# Patient Record
Sex: Female | Born: 1970 | Race: White | Hispanic: No | Marital: Married | Smoking: Never smoker
Health system: Southern US, Community
[De-identification: ages and names within clinical notes are randomized; demographics above are authoritative.]

## PROBLEM LIST (undated history)

## (undated) DIAGNOSIS — Z8619 Personal history of other infectious and parasitic diseases: Secondary | ICD-10-CM

## (undated) DIAGNOSIS — B9689 Other specified bacterial agents as the cause of diseases classified elsewhere: Secondary | ICD-10-CM

## (undated) DIAGNOSIS — E559 Vitamin D deficiency, unspecified: Secondary | ICD-10-CM

## (undated) DIAGNOSIS — G510 Bell's palsy: Secondary | ICD-10-CM

## (undated) DIAGNOSIS — F419 Anxiety disorder, unspecified: Secondary | ICD-10-CM

## (undated) DIAGNOSIS — M8580 Other specified disorders of bone density and structure, unspecified site: Secondary | ICD-10-CM

## (undated) DIAGNOSIS — B379 Candidiasis, unspecified: Secondary | ICD-10-CM

## (undated) DIAGNOSIS — N760 Acute vaginitis: Secondary | ICD-10-CM

## (undated) HISTORY — PX: WISDOM TOOTH EXTRACTION: SHX21

## (undated) HISTORY — DX: Acute vaginitis: N76.0

## (undated) HISTORY — DX: Personal history of other infectious and parasitic diseases: Z86.19

## (undated) HISTORY — DX: Bell's palsy: G51.0

## (undated) HISTORY — DX: Other specified bacterial agents as the cause of diseases classified elsewhere: B96.89

## (undated) HISTORY — DX: Vitamin D deficiency, unspecified: E55.9

## (undated) HISTORY — DX: Candidiasis, unspecified: B37.9

## (undated) HISTORY — DX: Anxiety disorder, unspecified: F41.9

## (undated) HISTORY — DX: Other specified disorders of bone density and structure, unspecified site: M85.80

---

## 1999-12-18 ENCOUNTER — Encounter: Admission: RE | Admit: 1999-12-18 | Discharge: 1999-12-18 | Payer: Self-pay | Admitting: Urology

## 1999-12-18 ENCOUNTER — Encounter: Payer: Self-pay | Admitting: Urology

## 2000-06-02 ENCOUNTER — Other Ambulatory Visit: Admission: RE | Admit: 2000-06-02 | Discharge: 2000-06-02 | Payer: Self-pay | Admitting: Internal Medicine

## 2001-06-08 ENCOUNTER — Other Ambulatory Visit: Admission: RE | Admit: 2001-06-08 | Discharge: 2001-06-08 | Payer: Self-pay | Admitting: Obstetrics and Gynecology

## 2002-08-29 ENCOUNTER — Other Ambulatory Visit: Admission: RE | Admit: 2002-08-29 | Discharge: 2002-08-29 | Payer: Self-pay | Admitting: Obstetrics and Gynecology

## 2003-09-27 ENCOUNTER — Other Ambulatory Visit: Admission: RE | Admit: 2003-09-27 | Discharge: 2003-09-27 | Payer: Self-pay | Admitting: Obstetrics and Gynecology

## 2004-12-09 ENCOUNTER — Other Ambulatory Visit: Admission: RE | Admit: 2004-12-09 | Discharge: 2004-12-09 | Payer: Self-pay | Admitting: Obstetrics and Gynecology

## 2005-10-12 ENCOUNTER — Emergency Department (HOSPITAL_COMMUNITY): Admission: EM | Admit: 2005-10-12 | Discharge: 2005-10-12 | Payer: Self-pay | Admitting: *Deleted

## 2005-11-17 ENCOUNTER — Other Ambulatory Visit: Admission: RE | Admit: 2005-11-17 | Discharge: 2005-11-17 | Payer: Self-pay | Admitting: Obstetrics and Gynecology

## 2005-11-25 ENCOUNTER — Encounter: Admission: RE | Admit: 2005-11-25 | Discharge: 2005-11-25 | Payer: Self-pay | Admitting: Obstetrics and Gynecology

## 2006-02-17 ENCOUNTER — Emergency Department (HOSPITAL_COMMUNITY): Admission: EM | Admit: 2006-02-17 | Discharge: 2006-02-17 | Payer: Self-pay | Admitting: Emergency Medicine

## 2010-08-09 ENCOUNTER — Encounter: Admission: RE | Admit: 2010-08-09 | Discharge: 2010-08-09 | Payer: Self-pay | Admitting: Obstetrics and Gynecology

## 2011-07-02 ENCOUNTER — Other Ambulatory Visit: Payer: Self-pay | Admitting: Internal Medicine

## 2011-07-02 DIAGNOSIS — Z1231 Encounter for screening mammogram for malignant neoplasm of breast: Secondary | ICD-10-CM

## 2011-08-14 ENCOUNTER — Ambulatory Visit
Admission: RE | Admit: 2011-08-14 | Discharge: 2011-08-14 | Disposition: A | Discharge status: Home / Self Care | Payer: 59 | Source: Ambulatory Visit | Attending: Internal Medicine | Admitting: Internal Medicine

## 2011-08-14 DIAGNOSIS — Z1231 Encounter for screening mammogram for malignant neoplasm of breast: Secondary | ICD-10-CM

## 2012-02-18 ENCOUNTER — Other Ambulatory Visit: Payer: Self-pay | Admitting: Obstetrics and Gynecology

## 2012-02-18 NOTE — Telephone Encounter (Signed)
[facility] PT 

## 2012-02-18 NOTE — Telephone Encounter (Signed)
Lm on vm to cb per rx request for bc.

## 2012-02-19 ENCOUNTER — Ambulatory Visit: Payer: Self-pay | Admitting: Obstetrics and Gynecology

## 2012-02-19 NOTE — Telephone Encounter (Signed)
Lm on vm to cb per rx req. 

## 2012-02-20 MED ORDER — NORETHIN-ETH ESTRAD TRIPHASIC 0.5/0.75/1-35 MG-MCG PO TABS: 1.0000 | ORAL_TABLET | Freq: Every day | ORAL | Status: AC

## 2012-02-20 MED ORDER — NORETHIN-ETH ESTRAD TRIPHASIC 0.5/0.75/1-35 MG-MCG PO TABS: 1.0000 | ORAL_TABLET | Freq: Every day | ORAL | Status: DC

## 2012-02-20 NOTE — Telephone Encounter (Signed)
Tc from pt per rx req. Informed pt needs name of pharm to send rx to. Pt uses [facility]([place]). AEX sched 05/05/12 with [facility]. Rx ON 777 e-pres to pharm. Pt voices understanding.

## 2012-05-05 ENCOUNTER — Ambulatory Visit (INDEPENDENT_AMBULATORY_CARE_PROVIDER_SITE_OTHER): Payer: 59 | Admitting: Obstetrics and Gynecology

## 2012-05-05 ENCOUNTER — Encounter: Payer: Self-pay | Admitting: Obstetrics and Gynecology

## 2012-05-05 VITALS — BP 126/70 | Ht 66.0 in | Wt 142.0 lb

## 2012-05-05 DIAGNOSIS — Z8742 Personal history of other diseases of the female genital tract: Secondary | ICD-10-CM

## 2012-05-05 DIAGNOSIS — N760 Acute vaginitis: Secondary | ICD-10-CM | POA: Insufficient documentation

## 2012-05-05 DIAGNOSIS — E559 Vitamin D deficiency, unspecified: Secondary | ICD-10-CM | POA: Insufficient documentation

## 2012-05-05 DIAGNOSIS — B379 Candidiasis, unspecified: Secondary | ICD-10-CM | POA: Insufficient documentation

## 2012-05-05 DIAGNOSIS — Z8619 Personal history of other infectious and parasitic diseases: Secondary | ICD-10-CM | POA: Insufficient documentation

## 2012-05-05 DIAGNOSIS — G510 Bell's palsy: Secondary | ICD-10-CM | POA: Insufficient documentation

## 2012-05-05 DIAGNOSIS — F411 Generalized anxiety disorder: Secondary | ICD-10-CM

## 2012-05-05 DIAGNOSIS — M8580 Other specified disorders of bone density and structure, unspecified site: Secondary | ICD-10-CM | POA: Insufficient documentation

## 2012-05-05 DIAGNOSIS — F419 Anxiety disorder, unspecified: Secondary | ICD-10-CM | POA: Insufficient documentation

## 2012-05-05 DIAGNOSIS — B9689 Other specified bacterial agents as the cause of diseases classified elsewhere: Secondary | ICD-10-CM | POA: Insufficient documentation

## 2012-05-05 NOTE — Progress Notes (Signed)
Subjective:  AEX:  Last Pap: 02/27/2010 WNL: Yes Regular Periods:yes Contraception: Nortrel 7,7,7  Monthly Breast exam:yes Tetanus<40yrs:yes Nl.Bladder Function:yes Daily BMs:yes Healthy Diet:yes Calcium:no Mammogram:yes Date of Mammogram: 07/2011 Exercise:yes Have often Exercise: daily Seatbelt: yes Abuse at home: no Stressful work:no Sigmoid-colonoscopy: n/a Bone Density: Yes [facility] 2011 PCP: [facility]  Change in PMH: None Change in [facility]: None    Jodi Tucker is a 41 y.o. female, G2P0011, who presents for an annual exam.     History   Social History  . Marital Status: Married    Spouse Name: N/A    Number of Children: N/A  . Years of Education: N/A   Social History Main Topics  . Smoking status: Never Smoker   . Smokeless tobacco: Never Used  . Alcohol Use: No  . Drug Use: No  . Sexually Active: Yes    Birth Control/ Protection: Pill     Nortrel 7 7 7     Other Topics Concern  . None   Social History Narrative  . None    Menstrual cycle:   LMP: Patient's last menstrual period was 05/04/2012.           Cycle: Regular on BCPs.  occassional meds needed for cramps  The following portions of the patient's history were reviewed and updated as appropriate: allergies, current medications, past family history, past medical history, past social history, past surgical history and problem list.  Review of Systems Pertinent items are noted in HPI. Breast:Negative for breast lump,nipple discharge or nipple retraction Gastrointestinal: Negative for abdominal pain, change in bowel habits or rectal bleeding Urinary:negative   Objective:    BP 126/70  Ht 5\' 6"  (1.676 m)  Wt 142 lb (64.411 kg)  BMI 22.92 kg/m2  LMP 05/04/2012    Weight:  Wt Readings from Last 1 Encounters:  05/05/12 142 lb (64.411 kg)          BMI: Body mass index is 22.92 kg/(m^2).  General Appearance: Alert, appropriate appearance for age. No acute distress HEENT:  Grossly normal Neck / Thyroid: Supple, no masses, nodes or enlargement Lungs: clear to auscultation bilaterally Back: No CVA tenderness Breast Exam: No masses or nodes.No dimpling, nipple retraction or discharge. Cardiovascular: Regular rate and rhythm. S1, S2, no murmur Gastrointestinal: Soft, non-tender, no masses or organomegaly Pelvic Exam: Vulva and vagina appear normal. Bimanual exam reveals normal uterus and adnexa. Rectovaginal: normal rectal, no masses Lymphatic Exam: Non-palpable nodes in neck, clavicular, axillary, or inguinal regions Skin: no rash or abnormalities Neurologic: Normal gait and speech, no tremor  Psychiatric: Alert and oriented, appropriate affect.   Wet Prep:not done Urinalysis:not applicable UPT: Not done   Assessment:    Normal gyn exam  Dysmenorrhea,only occassional meds required  Plan:    Mammogram due 07/2012 pap smear due 2014 return annually or prn STD screening: declined Contraception:oral contraceptives (estrogen/progesterone)      Dillard, HeatherMD

## 2012-06-02 ENCOUNTER — Other Ambulatory Visit: Payer: Self-pay | Admitting: Obstetrics and Gynecology

## 2012-06-18 ENCOUNTER — Emergency Department (HOSPITAL_COMMUNITY)
Admission: EM | Admit: 2012-06-18 | Discharge: 2012-06-19 | Disposition: A | Discharge status: Home / Self Care | Payer: 59 | Attending: Emergency Medicine | Admitting: Emergency Medicine

## 2012-06-18 ENCOUNTER — Encounter (HOSPITAL_COMMUNITY): Payer: Self-pay | Admitting: *Deleted

## 2012-06-18 DIAGNOSIS — R82998 Other abnormal findings in urine: Secondary | ICD-10-CM | POA: Insufficient documentation

## 2012-06-18 DIAGNOSIS — N201 Calculus of ureter: Secondary | ICD-10-CM | POA: Insufficient documentation

## 2012-06-18 DIAGNOSIS — R8271 Bacteriuria: Secondary | ICD-10-CM

## 2012-06-18 DIAGNOSIS — E876 Hypokalemia: Secondary | ICD-10-CM

## 2012-06-18 DIAGNOSIS — N200 Calculus of kidney: Secondary | ICD-10-CM

## 2012-06-18 DIAGNOSIS — R109 Unspecified abdominal pain: Secondary | ICD-10-CM | POA: Insufficient documentation

## 2012-06-18 MED ORDER — ONDANSETRON HCL 4 MG/2ML IJ SOLN
4.0000 mg | Freq: Once | INTRAMUSCULAR | Status: AC
  Administered 2012-06-18: 4 mg via INTRAVENOUS
  Filled 2012-06-18: qty 2

## 2012-06-18 MED ORDER — ONDANSETRON HCL 4 MG/2ML IJ SOLN: 4.0000 mg | Freq: Once | INTRAMUSCULAR | Status: DC

## 2012-06-18 MED ORDER — HYDROMORPHONE HCL PF 1 MG/ML IJ SOLN
1.0000 mg | Freq: Once | INTRAMUSCULAR | Status: AC
  Administered 2012-06-18: 1 mg via INTRAVENOUS
  Filled 2012-06-18: qty 1

## 2012-06-18 MED ORDER — KETOROLAC TROMETHAMINE 30 MG/ML IJ SOLN
30.0000 mg | Freq: Once | INTRAMUSCULAR | Status: AC
  Administered 2012-06-18: 30 mg via INTRAVENOUS
  Filled 2012-06-18: qty 1

## 2012-06-18 MED ORDER — FENTANYL CITRATE 0.05 MG/ML IJ SOLN: 50.0000 ug | Freq: Once | INTRAMUSCULAR | Status: DC

## 2012-06-18 NOTE — ED Notes (Addendum)
C/o r flank pain, also nv, onset 3 hrs ago. Vomited aleve PTA.  Pt restless, writhing in pain. Acuity 2 d/t pain.

## 2012-06-18 NOTE — ED Provider Notes (Signed)
History     CSN: [id number]  Arrival date & time 06/18/12  2301   First MD Initiated Contact with Patient 06/18/12 2329      Chief Complaint  Patient presents with  . Flank Pain    (Consider location/radiation/quality/duration/timing/severity/associated sxs/prior treatment) HPI Comments: 41 year old female with a history of occasional urinary infectionspresent with a complaint of right flank pain. This was acute in onset approximately 8:30 this evening, waxes and wanes in intensity, sharp and stabbing sensation to the right flank and radiating to the right upper quadrant.  She denies having symptoms like this in the past, she did note having occasional dysuria this week prior to the symptoms started. She has no hematuria, no fever, no chills, no cough or shortness of breath and has had no change in her stools. Symptoms are severe at worst  Patient is a 41 y.o. female presenting with flank pain. The history is provided by the patient.  Flank Pain    Past Medical History  Diagnosis Date  . History of chicken pox   . Preterm labor   . Yeast infection   . BV (bacterial vaginosis)   . Osteopenia   . Anxiety   . Bell's palsy   . Vitamin d deficiency     Past Surgical History  Procedure Date  . Wisdom tooth extraction     Family History  Problem Relation Age of Onset  . Crohn's disease Sister   . Crohn's disease Mother   . Crohn's disease Father   . Diabetes Paternal Grandmother   . Birth defects Cousin     History  Substance Use Topics  . Smoking status: Never Smoker   . Smokeless tobacco: Never Used  . Alcohol Use: No    OB History    Grav Para Term Preterm Abortions TAB SAB Ect Mult Living   2 1   1     1       Review of Systems  Genitourinary: Positive for flank pain.  All other systems reviewed and are negative.    Allergies  Other  Home Medications   Current Outpatient Rx  Name Route Sig Dispense Refill  . CITALOPRAM HYDROBROMIDE 20 MG PO TABS  Oral Take 20 mg by mouth daily.    Kathrynn Running ESTRAD TRIPHASIC 0.5/0.75/1-35 MG-MCG PO TABS Oral Take 1 tablet by mouth daily. 1 Package 2  . HYDROCODONE-ACETAMINOPHEN 5-500 MG PO TABS Oral Take 1-2 tablets by mouth every 6 (six) hours as needed for pain. 15 tablet 0  . NAPROXEN 500 MG PO TABS Oral Take 1 tablet (500 mg total) by mouth 2 (two) times daily with a meal. 30 tablet 0  . PROMETHAZINE HCL 25 MG PO TABS Oral Take 1 tablet (25 mg total) by mouth every 6 (six) hours as needed for nausea. 12 tablet 0  . SULFAMETHOXAZOLE-TRIMETHOPRIM 800-160 MG PO TABS Oral Take 1 tablet by mouth every 12 (twelve) hours. 6 tablet 0  . TAMSULOSIN HCL 0.4 MG PO CAPS Oral Take 1 capsule (0.4 mg total) by mouth 2 (two) times daily. 10 capsule 0    BP 142/111  Pulse 94  Temp 96.1 F (35.6 C) (Oral)  Resp 28  SpO2 99%  LMP 06/01/2012  Physical Exam  Nursing note and vitals reviewed. Constitutional: She appears well-developed and well-nourished. She appears distressed.  HENT:  Head: Normocephalic and atraumatic.  Mouth/Throat: Oropharynx is clear and moist. No oropharyngeal exudate.  Eyes: Conjunctivae normal and EOM are normal. Pupils are equal, round,  and reactive to light. Right eye exhibits no discharge. Left eye exhibits no discharge. No scleral icterus.  Neck: Normal range of motion. Neck supple. No JVD present. No thyromegaly present.  Cardiovascular: Normal rate, regular rhythm, normal heart sounds and intact distal pulses.  Exam reveals no gallop and no friction rub.   No murmur heard. Pulmonary/Chest: Effort normal and breath sounds normal. No respiratory distress. She has no wheezes. She has no rales.  Abdominal: Soft. Bowel sounds are normal. She exhibits no distension and no mass. There is tenderness ( Minimal right-sided tenderness, no pain at McBurney's point, no Murphy sign).       No left-sided tenderness, no peritoneal signs, right CVA tenderness present  Musculoskeletal: Normal  range of motion. She exhibits no edema and no tenderness.  Lymphadenopathy:    She has no cervical adenopathy.  Neurological: She is alert. Coordination normal.  Skin: Skin is warm and dry. No rash noted. No erythema.  Psychiatric: She has a normal mood and affect. Her behavior is normal.    ED Course  Procedures (including critical care time)  Labs Reviewed  URINALYSIS, ROUTINE W REFLEX MICROSCOPIC - Abnormal; Notable for the following:    APPearance TURBID (*)     Ketones, ur 15 (*)     All other components within normal limits  BASIC METABOLIC PANEL - Abnormal; Notable for the following:    Potassium 3.2 (*)     Glucose, Bld 105 (*)     All other components within normal limits  URINE MICROSCOPIC-ADD ON - Abnormal; Notable for the following:    Bacteria, UA MANY (*)     All other components within normal limits   Ct Abdomen Pelvis Wo Contrast  06/19/2012  *RADIOLOGY REPORT*  Clinical Data: Right flank and right lower quadrant abdominal pain.  CT ABDOMEN AND PELVIS WITHOUT CONTRAST  Technique:  Multidetector CT imaging of the abdomen and pelvis was performed following the standard protocol without intravenous contrast.  Comparison: None.  Findings: Dilatation of the right renal collecting system and ureter to the level of a 2 mm linear calcification in the distal ureter, just proximal to the ureteral vesicle junction.  Multiple small right pelvic phleboliths are also noted.  There is a 4 mm calculus in the upper pole of the left kidney.  No left ureteral calculi.  Poorly distended bladder with no bladder calculi demonstrated.  Unremarkable noncontrasted appearance of the uterus, ovaries, adrenal glands, liver, spleen, pancreas and gallbladder.  No gastrointestinal abnormalities or enlarged lymph nodes.  Normal appearing appendix.  Moderate posterior disc bulging at the L5-S1 level and mild diffuse disc bulging at the L2-3, L3-4 and L4-5 levels.  Clear lung bases.  IMPRESSION:  1.  2 mm  calculus in the distal right ureter, causing moderate right hydronephrosis and hydroureter. 2.  4 mm nonobstructing upper pole left renal calculus.   Original Report Authenticated By: Darrol Angel, M.D.      1. Kidney stone on right side   2. Bacteriuria   3. Hypokalemia       MDM  Uncomfortable appearing, presentation consistent with possible kidney stone, would consider a direct quadrant etiologies however CT scan of the abdomen and pelvis without contrast has been ordered for most likely diagnosis, urinalysis, check renal function, pain medications and reevaluate.   The patient has had complete relief of her symptoms with medications, I have reviewed her CT scan and laboratory data showing a mild hypokalemia, bacteriuria and a CT scan showing  a 2 mm distal right kidney stone. The patient appears comfortable and appears stable for discharge.  Discharge Prescriptions include:  Naprosyn Hydrocodone Flomax Phenergan      Vida Roller, MD 06/19/12 0104

## 2012-06-19 ENCOUNTER — Emergency Department (HOSPITAL_COMMUNITY): Payer: 59

## 2012-06-19 LAB — URINALYSIS, ROUTINE W REFLEX MICROSCOPIC
Bilirubin Urine: NEGATIVE
Glucose, UA: NEGATIVE mg/dL
Hgb urine dipstick: NEGATIVE
Ketones, ur: 15 mg/dL — AB
Leukocytes, UA: NEGATIVE
Nitrite: NEGATIVE
Protein, ur: NEGATIVE mg/dL
Specific Gravity, Urine: 1.018 (ref 1.005–1.030)
Urobilinogen, UA: 0.2 mg/dL (ref 0.0–1.0)
pH: 8 (ref 5.0–8.0)

## 2012-06-19 LAB — BASIC METABOLIC PANEL
BUN: 8 mg/dL (ref 6–23)
CO2: 19 mEq/L (ref 19–32)
Calcium: 10.3 mg/dL (ref 8.4–10.5)
Chloride: 97 mEq/L (ref 96–112)
Creatinine, Ser: 0.74 mg/dL (ref 0.50–1.10)
GFR calc Af Amer: >90 mL/min (ref >90)
GFR calc non Af Amer: >90 mL/min (ref >90)
Glucose, Bld: 105 mg/dL — ABNORMAL HIGH (ref 70–99)
Potassium: 3.2 mEq/L — ABNORMAL LOW (ref 3.5–5.1)
Sodium: 135 mEq/L (ref 135–145)

## 2012-06-19 LAB — URINE MICROSCOPIC-ADD ON

## 2012-06-19 MED ORDER — TAMSULOSIN HCL 0.4 MG PO CAPS: 0.4000 mg | ORAL_CAPSULE | Freq: Two times a day (BID) | ORAL | Status: AC

## 2012-06-19 MED ORDER — HYDROCODONE-ACETAMINOPHEN 5-500 MG PO TABS: 1.0000 | ORAL_TABLET | Freq: Four times a day (QID) | ORAL | Status: AC | PRN

## 2012-06-19 MED ORDER — SULFAMETHOXAZOLE-TRIMETHOPRIM 800-160 MG PO TABS: 1.0000 | ORAL_TABLET | Freq: Two times a day (BID) | ORAL | Status: AC

## 2012-06-19 MED ORDER — PROMETHAZINE HCL 25 MG PO TABS: 25.0000 mg | ORAL_TABLET | Freq: Four times a day (QID) | ORAL | Status: AC | PRN

## 2012-06-19 MED ORDER — NAPROXEN 500 MG PO TABS: 500.0000 mg | ORAL_TABLET | Freq: Two times a day (BID) | ORAL | Status: AC

## 2012-06-19 NOTE — ED Notes (Signed)
Pt writhing in pain  C/o sudden onset RUQ abd pain , right flank pain with radiaton to right back.  Sudden onset at 2000 hrs associated with nausea and vomiting times three,  Actively wretching.  Family at bedside.  IV hydration in progress.

## 2012-07-07 ENCOUNTER — Other Ambulatory Visit: Payer: Self-pay | Admitting: Internal Medicine

## 2012-07-07 DIAGNOSIS — Z1231 Encounter for screening mammogram for malignant neoplasm of breast: Secondary | ICD-10-CM

## 2012-08-16 ENCOUNTER — Ambulatory Visit
Admission: RE | Admit: 2012-08-16 | Discharge: 2012-08-16 | Disposition: A | Discharge status: Home / Self Care | Payer: BC Managed Care – PPO | Source: Ambulatory Visit | Attending: Internal Medicine | Admitting: Internal Medicine

## 2012-08-16 DIAGNOSIS — Z1231 Encounter for screening mammogram for malignant neoplasm of breast: Secondary | ICD-10-CM

## 2013-08-23 ENCOUNTER — Other Ambulatory Visit: Payer: Self-pay | Admitting: Internal Medicine

## 2013-08-23 DIAGNOSIS — Z1231 Encounter for screening mammogram for malignant neoplasm of breast: Secondary | ICD-10-CM

## 2013-09-27 ENCOUNTER — Ambulatory Visit
Admission: RE | Admit: 2013-09-27 | Discharge: 2013-09-27 | Disposition: A | Discharge status: Home / Self Care | Payer: BC Managed Care – PPO | Source: Ambulatory Visit | Attending: Internal Medicine | Admitting: Internal Medicine

## 2013-09-27 DIAGNOSIS — Z1231 Encounter for screening mammogram for malignant neoplasm of breast: Secondary | ICD-10-CM

## 2014-07-17 ENCOUNTER — Encounter (HOSPITAL_COMMUNITY): Payer: Self-pay | Admitting: *Deleted

## 2014-08-23 ENCOUNTER — Other Ambulatory Visit: Payer: Self-pay

## 2014-08-23 DIAGNOSIS — Z1231 Encounter for screening mammogram for malignant neoplasm of breast: Secondary | ICD-10-CM

## 2014-09-29 ENCOUNTER — Ambulatory Visit
Admission: RE | Admit: 2014-09-29 | Discharge: 2014-09-29 | Disposition: A | Discharge status: Home / Self Care | Payer: BLUE CROSS/BLUE SHIELD | Source: Ambulatory Visit

## 2014-09-29 DIAGNOSIS — Z1231 Encounter for screening mammogram for malignant neoplasm of breast: Secondary | ICD-10-CM

## 2018-09-30 DIAGNOSIS — Z309 Encounter for contraceptive management, unspecified: Secondary | ICD-10-CM | POA: Diagnosis not present

## 2018-12-30 DIAGNOSIS — Z309 Encounter for contraceptive management, unspecified: Secondary | ICD-10-CM | POA: Diagnosis not present

## 2019-09-22 DIAGNOSIS — E7801 Familial hypercholesterolemia: Secondary | ICD-10-CM | POA: Diagnosis not present

## 2019-09-22 DIAGNOSIS — M859 Disorder of bone density and structure, unspecified: Secondary | ICD-10-CM | POA: Diagnosis not present

## 2019-09-26 DIAGNOSIS — E7801 Familial hypercholesterolemia: Secondary | ICD-10-CM | POA: Diagnosis not present

## 2019-09-26 DIAGNOSIS — M8580 Other specified disorders of bone density and structure, unspecified site: Secondary | ICD-10-CM | POA: Diagnosis not present

## 2019-09-26 DIAGNOSIS — F418 Other specified anxiety disorders: Secondary | ICD-10-CM | POA: Diagnosis not present

## 2019-09-26 DIAGNOSIS — E559 Vitamin D deficiency, unspecified: Secondary | ICD-10-CM | POA: Diagnosis not present

## 2019-09-26 DIAGNOSIS — Z0000 Encounter for general adult medical examination without abnormal findings: Secondary | ICD-10-CM | POA: Diagnosis not present

## 2019-09-29 DIAGNOSIS — Z23 Encounter for immunization: Secondary | ICD-10-CM | POA: Diagnosis not present

## 2019-10-20 DIAGNOSIS — Z01419 Encounter for gynecological examination (general) (routine) without abnormal findings: Secondary | ICD-10-CM | POA: Diagnosis not present

## 2019-10-20 DIAGNOSIS — Z124 Encounter for screening for malignant neoplasm of cervix: Secondary | ICD-10-CM | POA: Diagnosis not present

## 2019-10-20 DIAGNOSIS — Z6822 Body mass index (BMI) 22.0-22.9, adult: Secondary | ICD-10-CM | POA: Diagnosis not present

## 2019-10-20 DIAGNOSIS — Z1231 Encounter for screening mammogram for malignant neoplasm of breast: Secondary | ICD-10-CM | POA: Diagnosis not present

## 2019-11-02 ENCOUNTER — Other Ambulatory Visit: Payer: Self-pay | Admitting: Obstetrics & Gynecology

## 2019-11-02 DIAGNOSIS — N6002 Solitary cyst of left breast: Secondary | ICD-10-CM

## 2019-11-22 ENCOUNTER — Ambulatory Visit
Admission: RE | Admit: 2019-11-22 | Discharge: 2019-11-22 | Disposition: A | Discharge status: Home / Self Care | Payer: BC Managed Care – PPO | Source: Ambulatory Visit | Attending: Obstetrics & Gynecology | Admitting: Obstetrics & Gynecology

## 2019-11-22 ENCOUNTER — Other Ambulatory Visit: Payer: Self-pay

## 2019-11-22 DIAGNOSIS — R922 Inconclusive mammogram: Secondary | ICD-10-CM | POA: Diagnosis not present

## 2019-11-22 DIAGNOSIS — N6002 Solitary cyst of left breast: Secondary | ICD-10-CM

## 2019-11-22 DIAGNOSIS — N6012 Diffuse cystic mastopathy of left breast: Secondary | ICD-10-CM | POA: Diagnosis not present

## 2019-12-28 DIAGNOSIS — Z309 Encounter for contraceptive management, unspecified: Secondary | ICD-10-CM | POA: Diagnosis not present

## 2020-09-15 IMAGING — MG MM DIGITAL DIAGNOSTIC UNILAT*L* W/ TOMO W/ CAD
6 series · 6 of 14 positions shown · non-contrast
Comparison: 10/20/2019 and prior outside screening mammograms from
[REDACTED[facility].

CLINICAL DATA: 48-year-old female for further evaluation of
possible LEFT breast mass on screening mammogram.

EXAM:
DIGITAL DIAGNOSTIC LEFT MAMMOGRAM WITH CAD AND TOMO
ULTRASOUND LEFT BREAST

[L CC]
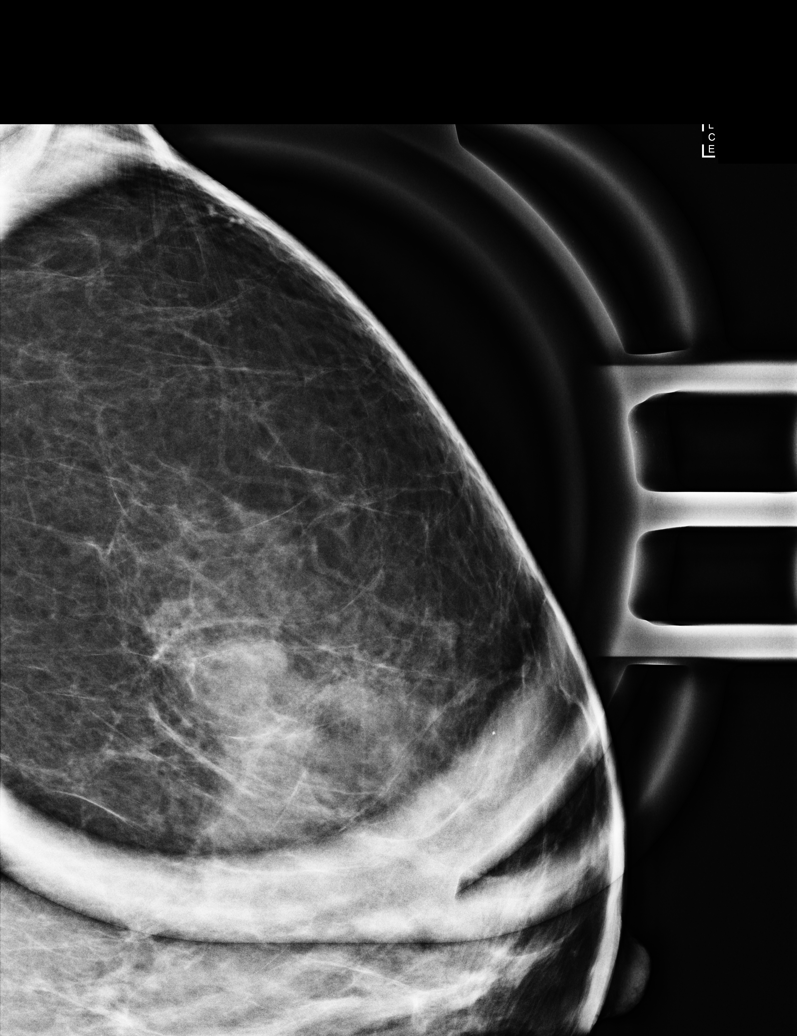

[L ML]
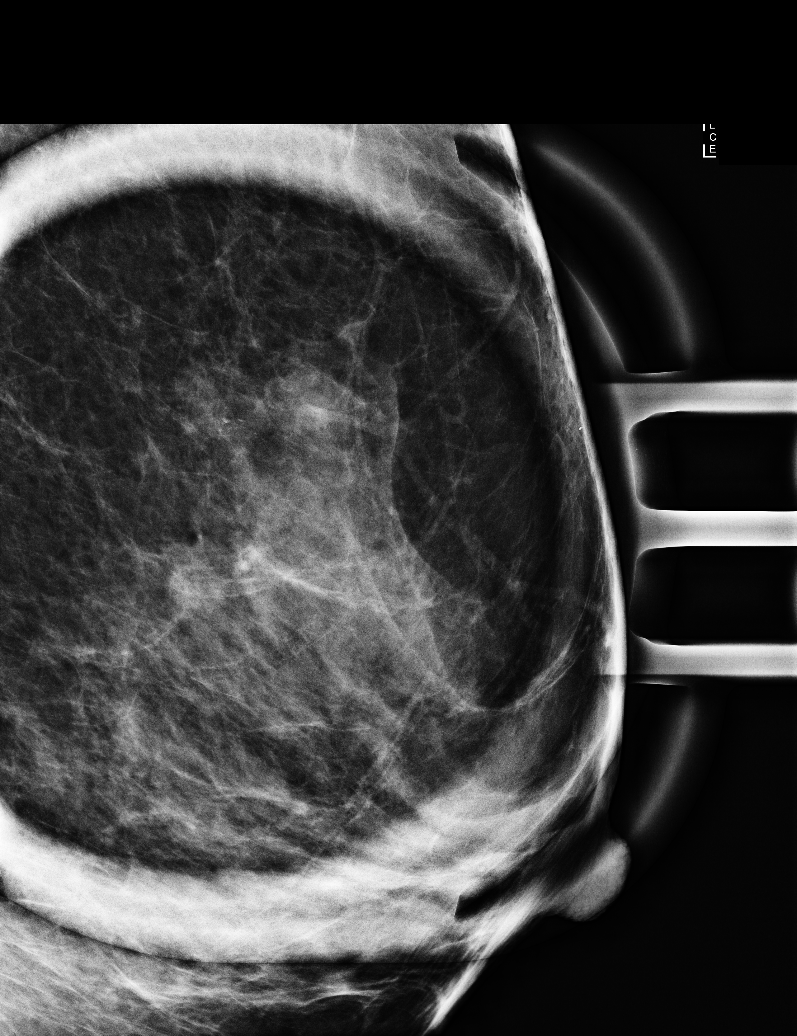

[L MLO synth-2D]
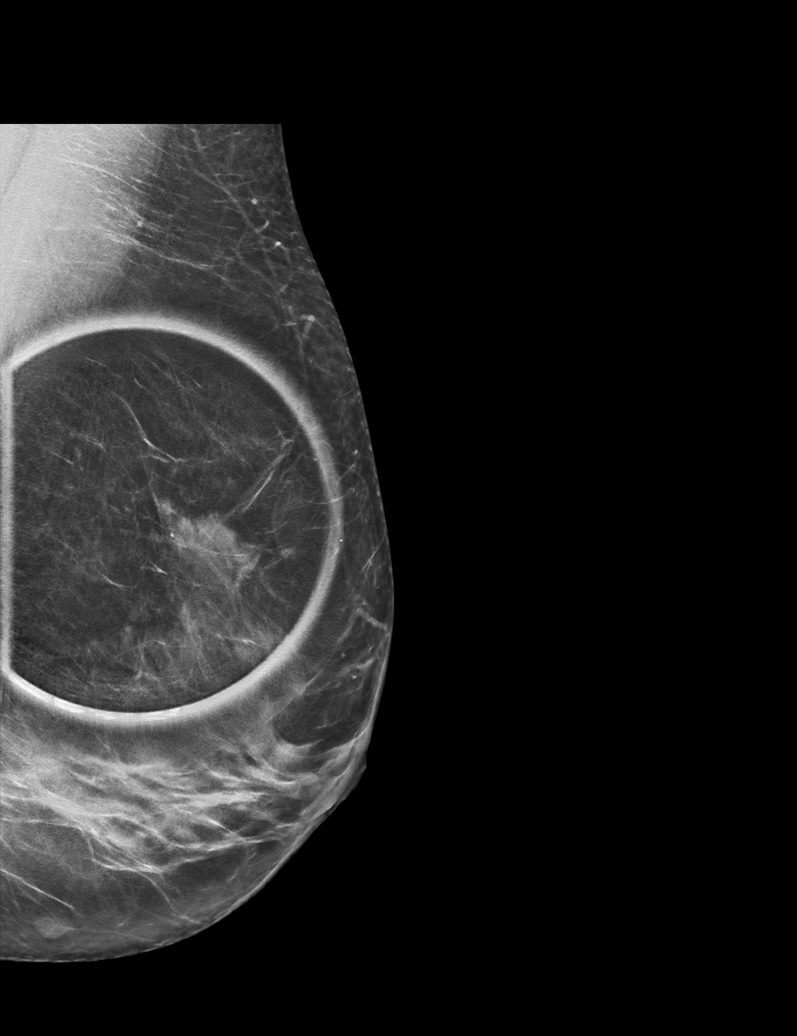

[L ML synth-2D]
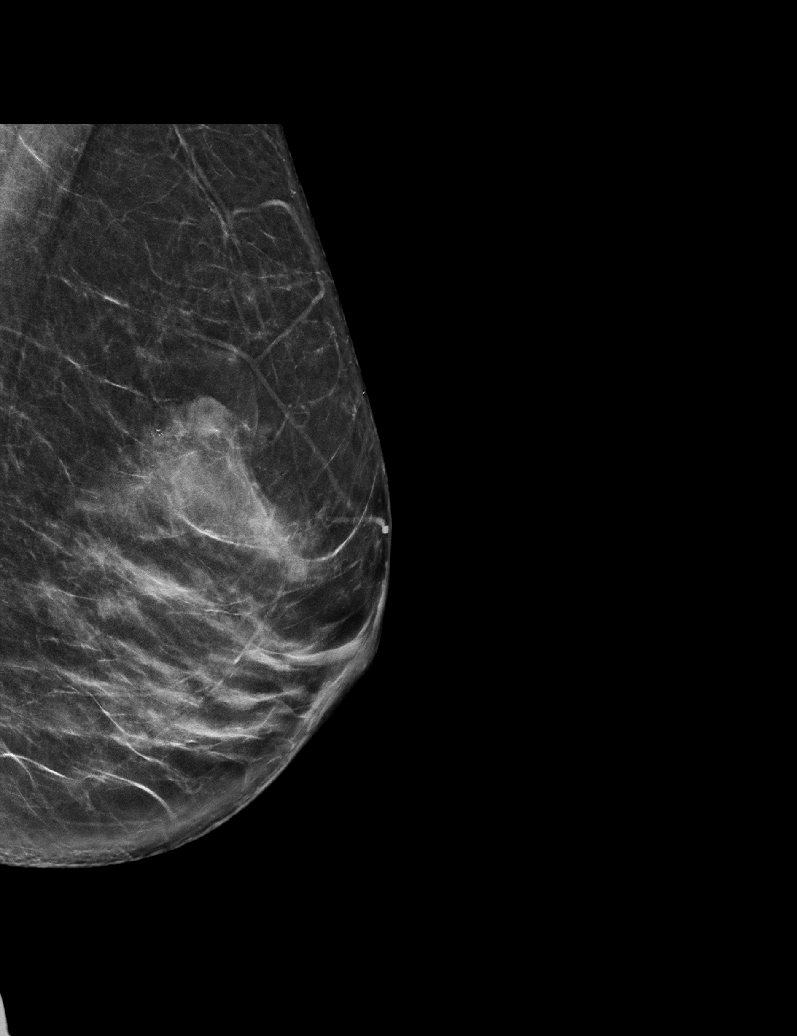

[L MLO tomo · tomo slice 31/62.0]
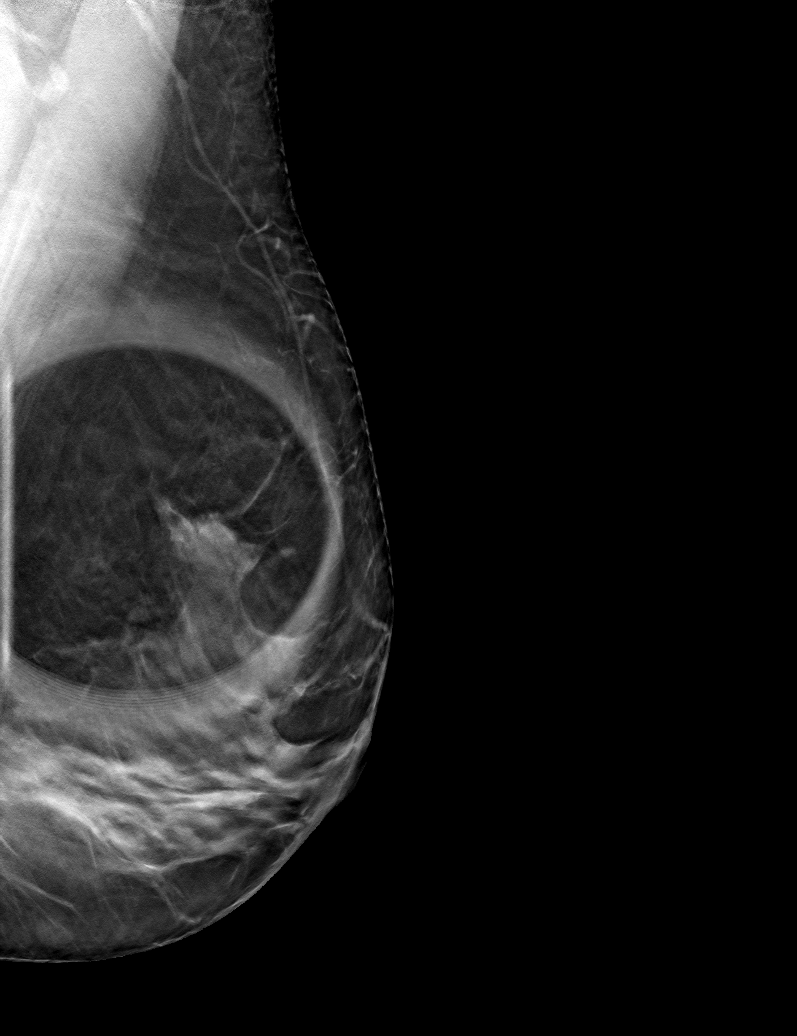

[L ML tomo · tomo slice 35/68.0]
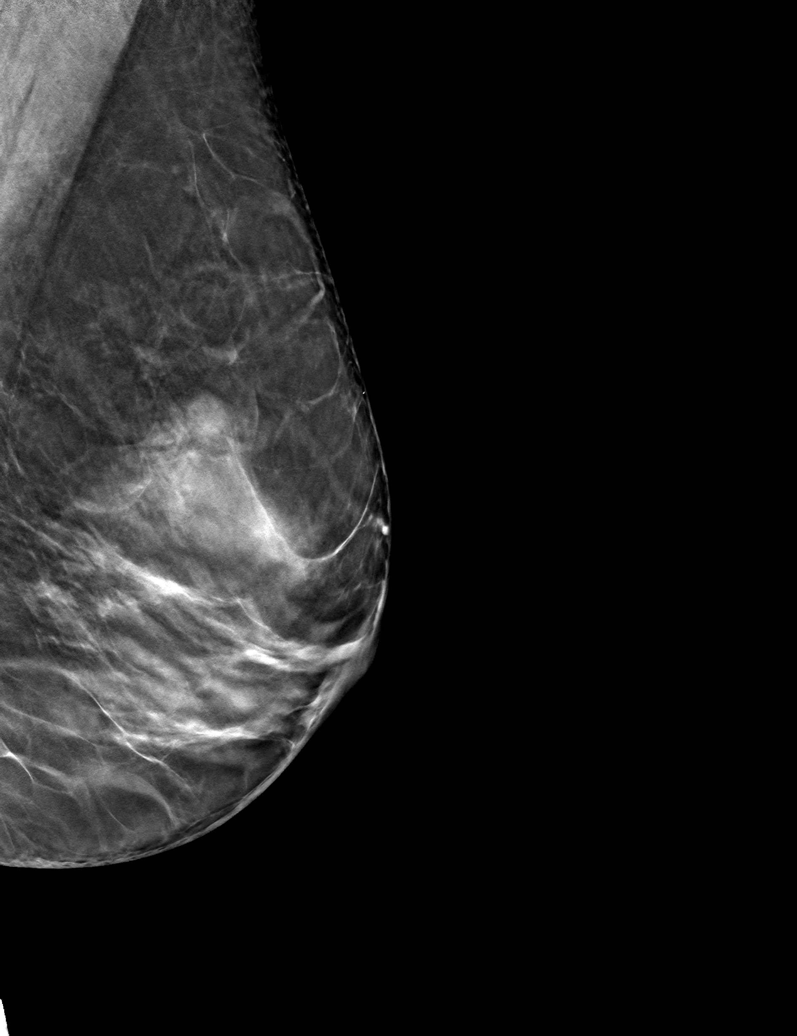

[6 of 14 positions shown; findings below may reference images not displayed]

ACR Breast Density Category c: The breast tissue is heterogeneously
dense, which may obscure small masses.
FINDINGS: 2D/3D full field and spot compression views and magnification views
of the LEFT breast demonstrate a persistent 1.1 cm circumscribed
oval mass within the UPPER-OUTER LEFT breast. Adjacent
calcifications layer on the LATERAL view compatible with benign milk
of calcium.

Mammographic images were processed with CAD.

Targeted ultrasound is performed, showing a cluster of benign cysts
at the 1 o'clock position of the LEFT breast 4 cm from the nipple,
the largest measuring 1.1 x 0.7 x 0.9 cm, corresponding to the
screening study finding.
IMPRESSION: Benign cystic changes within the UPPER-OUTER LEFT breast
corresponding to the screening study finding.

RECOMMENDATION:
Bilateral screening mammogram in 1 year.

I have discussed the findings and recommendations with the patient.
If applicable, a reminder letter will be sent to the patient
regarding the next appointment.

BI-RADS CATEGORY  2: Benign.

## 2021-09-02 ENCOUNTER — Encounter: Payer: Self-pay | Admitting: Obstetrics & Gynecology

## 2022-06-11 ENCOUNTER — Other Ambulatory Visit: Payer: Self-pay | Admitting: Family Medicine

## 2022-06-11 DIAGNOSIS — Z1231 Encounter for screening mammogram for malignant neoplasm of breast: Secondary | ICD-10-CM

## 2023-02-14 ENCOUNTER — Other Ambulatory Visit: Payer: Self-pay

## 2023-02-14 ENCOUNTER — Emergency Department
Admission: EM | Admit: 2023-02-14 | Discharge: 2023-02-14 | Disposition: A | Discharge status: Home / Self Care | Payer: BC Managed Care – PPO | Attending: Emergency Medicine | Admitting: Emergency Medicine

## 2023-02-14 ENCOUNTER — Emergency Department: Payer: BC Managed Care – PPO

## 2023-02-14 DIAGNOSIS — W01198A Fall on same level from slipping, tripping and stumbling with subsequent striking against other object, initial encounter: Secondary | ICD-10-CM | POA: Diagnosis not present

## 2023-02-14 DIAGNOSIS — Y92094 Garage of other non-institutional residence as the place of occurrence of the external cause: Secondary | ICD-10-CM | POA: Insufficient documentation

## 2023-02-14 DIAGNOSIS — R55 Syncope and collapse: Secondary | ICD-10-CM | POA: Diagnosis present

## 2023-02-14 DIAGNOSIS — S060XAA Concussion with loss of consciousness status unknown, initial encounter: Secondary | ICD-10-CM

## 2023-02-14 DIAGNOSIS — S060X0A Concussion without loss of consciousness, initial encounter: Secondary | ICD-10-CM | POA: Diagnosis not present

## 2023-02-14 LAB — CBC
HCT: 39.7 % (ref 36.0–46.0)
Hemoglobin: 13.3 g/dL (ref 12.0–15.0)
MCH: 31.2 pg (ref 26.0–34.0)
MCHC: 33.5 g/dL (ref 30.0–36.0)
MCV: 93.2 fL (ref 80.0–100.0)
Platelets: 288 10*3/uL (ref 150–400)
RBC: 4.26 MIL/uL (ref 3.87–5.11)
RDW: 12.9 % (ref 11.5–15.5)
WBC: 10.7 10*3/uL — ABNORMAL HIGH (ref 4.0–10.5)
nRBC: 0 % (ref 0.0–0.2)

## 2023-02-14 LAB — URINALYSIS, ROUTINE W REFLEX MICROSCOPIC
Bilirubin Urine: NEGATIVE
Glucose, UA: NEGATIVE mg/dL
Hgb urine dipstick: NEGATIVE
Ketones, ur: NEGATIVE mg/dL
Leukocytes,Ua: NEGATIVE
Nitrite: NEGATIVE
Protein, ur: NEGATIVE mg/dL
Specific Gravity, Urine: 1.004 — ABNORMAL LOW (ref 1.005–1.030)
pH: 7 (ref 5.0–8.0)

## 2023-02-14 LAB — BASIC METABOLIC PANEL
Anion gap: 10 (ref 5–15)
BUN: 10 mg/dL (ref 6–20)
CO2: 23 mmol/L (ref 22–32)
Calcium: 9.4 mg/dL (ref 8.9–10.3)
Chloride: 104 mmol/L (ref 98–111)
Creatinine, Ser: 0.79 mg/dL (ref 0.44–1.00)
GFR, Estimated: >60 mL/min (ref >60)
Glucose, Bld: 107 mg/dL — ABNORMAL HIGH (ref 70–99)
Potassium: 2.8 mmol/L — ABNORMAL LOW (ref 3.5–5.1)
Sodium: 137 mmol/L (ref 135–145)

## 2023-02-14 MED ORDER — POTASSIUM CHLORIDE CRYS ER 20 MEQ PO TBCR
60.0000 meq | EXTENDED_RELEASE_TABLET | Freq: Once | ORAL | Status: AC
  Administered 2023-02-14: 60 meq via ORAL
  Filled 2023-02-14: qty 3

## 2023-02-14 MED ORDER — ONDANSETRON HCL 4 MG/2ML IJ SOLN
4.0000 mg | Freq: Once | INTRAMUSCULAR | Status: AC
  Administered 2023-02-14: 4 mg via INTRAVENOUS
  Filled 2023-02-14: qty 2

## 2023-02-14 MED ORDER — ONDANSETRON 4 MG PO TBDP: 4.0000 mg | ORAL_TABLET | Freq: Three times a day (TID) | ORAL | 0 refills | Status: AC | PRN

## 2023-02-14 MED ORDER — POTASSIUM CHLORIDE 10 MEQ/100ML IV SOLN
10.0000 meq | INTRAVENOUS | Status: AC
  Administered 2023-02-14 (×2): 10 meq via INTRAVENOUS
  Filled 2023-02-14 (×2): qty 100

## 2023-02-14 MED ORDER — SODIUM CHLORIDE 0.9 % IV BOLUS
1000.0000 mL | Freq: Once | INTRAVENOUS | Status: AC
  Administered 2023-02-14: 1000 mL via INTRAVENOUS

## 2023-02-14 NOTE — ED Notes (Signed)
RN unable to answer call light in time. Pt incontinent of urine. Pt given mesh underwear and paper scrub bottoms and wipes. Changed bed sheets. Family at bedside. Pt sitting in chair. Given warm blankets. Denies further needs at this time.

## 2023-02-14 NOTE — ED Notes (Signed)
Pt to CT

## 2023-02-14 NOTE — ED Provider Notes (Signed)
[facility] Provider Note    Event Date/Time   First MD Initiated Contact with Patient 02/14/23 1810     (approximate)   History   Chief Complaint: Fall   HPI  Jodi Tucker is a 53 y.o. female with no significant past medical history who reports being in her usual state of health today, abdomen about throughout the morning, and then shortly after lunchtime she started to feel lightheaded and fatigued.  She went back home, but when she stood up to get out of the car she passed out and fell in her garage, hitting her head on the concrete floor.  Denies any preceding pain such as headache chest pain shortness of breath abdominal pain.  No motor weakness paresthesias or vision changes.  She regained consciousness, denies any pain except for headache from where she hit her head on the floor.  She does report that this morning she had salsa and chips and coffee for breakfast, and for lunch she had a margarita only.     Physical Exam   Triage Vital Signs: ED Triage Vitals  Enc Vitals Group     BP 02/14/23 1737 102/73     Pulse Rate 02/14/23 1737 83     Resp 02/14/23 1737 16     Temp 02/14/23 1737 97.7 F (36.5 C)     Temp Source 02/14/23 1737 Oral     SpO2 02/14/23 1737 98 %     Weight --      Height --      Head Circumference --      Peak Flow --      Pain Score 02/14/23 1735 3     Pain Loc --      Pain Edu? --      Excl. in GC? --     Most recent vital signs: Vitals:   02/14/23 1737 02/14/23 2000  BP: 102/73 136/74  Pulse: 83 75  Resp: 16   Temp: 97.7 F (36.5 C)   SpO2: 98% 100%    General: Awake, no distress.  CV:  Good peripheral perfusion.  Regular rate and rhythm Resp:  Normal effort.  Clear to auscultation bilaterally Abd:  No distention.  Soft nontender Other:  No open wounds.  There is an area of contusion visible at the indicated area of pain on the posterior scalp.  No motor drift.  Finger-to-nose and heel-to-shin are  normal.  Speech and language are normal.  Orientation normal.  NIH stroke scale 0  No midline spinal tenderness.  Full range of motion in C-spine   ED Results / Procedures / Treatments   Labs (all labs ordered are listed, but only abnormal results are displayed) Labs Reviewed  BASIC METABOLIC PANEL - Abnormal; Notable for the following components:      Result Value   Potassium 2.8 (*)    Glucose, Bld 107 (*)    All other components within normal limits  CBC - Abnormal; Notable for the following components:   WBC 10.7 (*)    All other components within normal limits  URINALYSIS, ROUTINE W REFLEX MICROSCOPIC - Abnormal; Notable for the following components:   Color, Urine STRAW (*)    APPearance CLEAR (*)    Specific Gravity, Urine 1.004 (*)    All other components within normal limits  CBG MONITORING, ED     EKG Interpreted by me Normal sinus rhythm rate of 83.  Normal axis intervals QRS ST segments and T waves  RADIOLOGY CT head interpreted by me, negative for intracranial hemorrhage.  Radiology report reviewed   PROCEDURES:  Procedures   MEDICATIONS ORDERED IN ED: Medications  sodium chloride 0.9 % bolus 1,000 mL (0 mLs Intravenous Stopped 02/14/23 2119)  ondansetron (ZOFRAN) injection 4 mg (4 mg Intravenous Given 02/14/23 1842)  potassium chloride 10 mEq in 100 mL IVPB (0 mEq Intravenous Stopped 02/14/23 2119)  potassium chloride SA (KLOR-CON M) CR tablet 60 mEq (60 mEq Oral Given 02/14/23 1935)     IMPRESSION / MDM / ASSESSMENT AND PLAN / ED COURSE  I reviewed the triage vital signs and the nursing notes.  DDx: Dehydration, anemia, electrolyte abnormality, AKI, UTI, intracranial hemorrhage  Patient's presentation is most consistent with acute presentation with potential threat to life or bodily function.  Patient presents after syncope fall at home, likely due to dehydration.  Patient given IV fluids and Zofran in the ED and feels much better, ambulatory with  steady gait.  Labs show hypokalemia which was replaced in the ED.  Vital signs remain normal.  She is urinating spontaneously and stable for discharge home.  No evidence of any acute neurologic, cardiac or vascular event as the precipitating cause.       FINAL CLINICAL IMPRESSION(S) / ED DIAGNOSES   Final diagnoses:  Syncope, unspecified syncope type  Concussion with unknown loss of consciousness status, initial encounter     Rx / DC Orders   ED Discharge Orders          Ordered    ondansetron (ZOFRAN-ODT) 4 MG disintegrating tablet  Every 8 hours PRN        02/14/23 2120             Note:  This document was prepared using Dragon voice recognition software and may include unintentional dictation errors.   Sharman Cheek, MD 02/14/23 2125

## 2023-02-14 NOTE — ED Notes (Signed)
Pt up to the restroom with a slow steady gait. Given warm blankets.

## 2023-02-14 NOTE — ED Triage Notes (Signed)
Pt presents via POV c/o fall at home. Reports syncopal episode. Reports dizziness. Pt reports hit head. Denies anticoagulants.

## 2023-02-14 NOTE — ED Notes (Signed)
Pt up to the bedside commode to provide urine sample.  

## 2023-02-14 NOTE — ED Notes (Signed)
Pt up to the bedside commode 

## 2024-02-01 DIAGNOSIS — E7801 Familial hypercholesterolemia: Secondary | ICD-10-CM | POA: Diagnosis not present

## 2024-02-01 DIAGNOSIS — M545 Low back pain, unspecified: Secondary | ICD-10-CM | POA: Diagnosis not present

## 2024-04-14 DIAGNOSIS — Z309 Encounter for contraceptive management, unspecified: Secondary | ICD-10-CM | POA: Diagnosis not present

## 2024-05-09 DIAGNOSIS — R399 Unspecified symptoms and signs involving the genitourinary system: Secondary | ICD-10-CM | POA: Diagnosis not present

## 2024-06-06 DIAGNOSIS — R194 Change in bowel habit: Secondary | ICD-10-CM | POA: Diagnosis not present

## 2024-06-06 DIAGNOSIS — K921 Melena: Secondary | ICD-10-CM | POA: Diagnosis not present

## 2024-06-06 DIAGNOSIS — S50371A Other superficial bite of right elbow, initial encounter: Secondary | ICD-10-CM | POA: Diagnosis not present

## 2024-06-06 DIAGNOSIS — I10 Essential (primary) hypertension: Secondary | ICD-10-CM | POA: Diagnosis not present

## 2024-06-06 DIAGNOSIS — Z23 Encounter for immunization: Secondary | ICD-10-CM | POA: Diagnosis not present

## 2024-06-13 DIAGNOSIS — K921 Melena: Secondary | ICD-10-CM | POA: Diagnosis not present

## 2024-06-15 DIAGNOSIS — Z6821 Body mass index (BMI) 21.0-21.9, adult: Secondary | ICD-10-CM | POA: Diagnosis not present

## 2024-06-15 DIAGNOSIS — N951 Menopausal and female climacteric states: Secondary | ICD-10-CM | POA: Diagnosis not present

## 2024-06-15 DIAGNOSIS — R87610 Atypical squamous cells of undetermined significance on cytologic smear of cervix (ASC-US): Secondary | ICD-10-CM | POA: Diagnosis not present

## 2024-06-15 DIAGNOSIS — Z01419 Encounter for gynecological examination (general) (routine) without abnormal findings: Secondary | ICD-10-CM | POA: Diagnosis not present

## 2024-06-21 DIAGNOSIS — R194 Change in bowel habit: Secondary | ICD-10-CM | POA: Diagnosis not present

## 2024-06-21 DIAGNOSIS — R195 Other fecal abnormalities: Secondary | ICD-10-CM | POA: Diagnosis not present

## 2024-06-21 DIAGNOSIS — R197 Diarrhea, unspecified: Secondary | ICD-10-CM | POA: Diagnosis not present

## 2024-06-21 DIAGNOSIS — K602 Anal fissure, unspecified: Secondary | ICD-10-CM | POA: Diagnosis not present

## 2024-06-24 DIAGNOSIS — Z1231 Encounter for screening mammogram for malignant neoplasm of breast: Secondary | ICD-10-CM | POA: Diagnosis not present

## 2024-07-06 DIAGNOSIS — I10 Essential (primary) hypertension: Secondary | ICD-10-CM | POA: Diagnosis not present

## 2024-07-06 DIAGNOSIS — O210 Mild hyperemesis gravidarum: Secondary | ICD-10-CM | POA: Diagnosis not present

## 2024-07-06 DIAGNOSIS — F17200 Nicotine dependence, unspecified, uncomplicated: Secondary | ICD-10-CM | POA: Diagnosis not present

## 2024-07-06 DIAGNOSIS — E7800 Pure hypercholesterolemia, unspecified: Secondary | ICD-10-CM | POA: Diagnosis not present

## 2024-07-06 DIAGNOSIS — R635 Abnormal weight gain: Secondary | ICD-10-CM | POA: Diagnosis not present

## 2024-07-06 DIAGNOSIS — R195 Other fecal abnormalities: Secondary | ICD-10-CM | POA: Diagnosis not present

## 2024-07-27 DIAGNOSIS — E876 Hypokalemia: Secondary | ICD-10-CM | POA: Diagnosis not present

## 2024-07-27 DIAGNOSIS — R197 Diarrhea, unspecified: Secondary | ICD-10-CM | POA: Diagnosis not present
